# Patient Record
Sex: Female | Born: 1960 | Race: White | Hispanic: No | Marital: Married | Smoking: Never smoker
Health system: Southern US, Community
[De-identification: ages and names within clinical notes are randomized; demographics above are authoritative.]

---

## 2020-02-05 ENCOUNTER — Other Ambulatory Visit: Payer: Self-pay

## 2020-02-05 ENCOUNTER — Emergency Department (HOSPITAL_BASED_OUTPATIENT_CLINIC_OR_DEPARTMENT_OTHER): Payer: 59

## 2020-02-05 ENCOUNTER — Encounter (HOSPITAL_BASED_OUTPATIENT_CLINIC_OR_DEPARTMENT_OTHER): Payer: Self-pay | Admitting: *Deleted

## 2020-02-05 DIAGNOSIS — R6884 Jaw pain: Secondary | ICD-10-CM | POA: Diagnosis not present

## 2020-02-05 DIAGNOSIS — R0789 Other chest pain: Secondary | ICD-10-CM | POA: Diagnosis not present

## 2020-02-05 DIAGNOSIS — Z5321 Procedure and treatment not carried out due to patient leaving prior to being seen by health care provider: Secondary | ICD-10-CM | POA: Diagnosis not present

## 2020-02-05 LAB — BASIC METABOLIC PANEL
Anion gap: 10 (ref 5–15)
BUN: 14 mg/dL (ref 6–20)
CO2: 28 mmol/L (ref 22–32)
Calcium: 9.3 mg/dL (ref 8.9–10.3)
Chloride: 103 mmol/L (ref 98–111)
Creatinine, Ser: 0.69 mg/dL (ref 0.44–1.00)
GFR calc Af Amer: >60 mL/min (ref >60)
GFR calc non Af Amer: >60 mL/min (ref >60)
Glucose, Bld: 115 mg/dL — ABNORMAL HIGH (ref 70–99)
Potassium: 4 mmol/L (ref 3.5–5.1)
Sodium: 141 mmol/L (ref 135–145)

## 2020-02-05 LAB — TROPONIN I (HIGH SENSITIVITY): Troponin I (High Sensitivity): 2 ng/L (ref <18)

## 2020-02-05 LAB — CBC
HCT: 42.5 % (ref 36.0–46.0)
Hemoglobin: 13.8 g/dL (ref 12.0–15.0)
MCH: 29.9 pg (ref 26.0–34.0)
MCHC: 32.5 g/dL (ref 30.0–36.0)
MCV: 92.2 fL (ref 80.0–100.0)
Platelets: 200 10*3/uL (ref 150–400)
RBC: 4.61 MIL/uL (ref 3.87–5.11)
RDW: 12.1 % (ref 11.5–15.5)
WBC: 8.4 10*3/uL (ref 4.0–10.5)
nRBC: 0 % (ref 0.0–0.2)

## 2020-02-05 MED ORDER — SODIUM CHLORIDE 0.9% FLUSH
3.0000 mL | Freq: Once | INTRAVENOUS | Status: DC
  Filled 2020-02-05: qty 3

## 2020-02-05 NOTE — ED Triage Notes (Signed)
Chest tightness. She had tightness in her left jaw a week ago that she ignored.

## 2020-02-06 ENCOUNTER — Emergency Department (HOSPITAL_BASED_OUTPATIENT_CLINIC_OR_DEPARTMENT_OTHER)
Admission: EM | Admit: 2020-02-06 | Discharge: 2020-02-06 | Disposition: A | Discharge status: Home / Self Care | Payer: 59 | Attending: Emergency Medicine | Admitting: Emergency Medicine

## 2020-02-06 NOTE — ED Notes (Signed)
Called to take to room  No response from lobby  

## 2020-02-06 NOTE — ED Notes (Signed)
Called for second time  No response  

## 2021-12-20 IMAGING — CR DG CHEST 2V
2 series · 2 of 2 positions shown · non-contrast
Comparison: None.

CLINICAL DATA: Chest pain and tightness.

EXAM:
CHEST - 2 VIEW

[w chest pa]
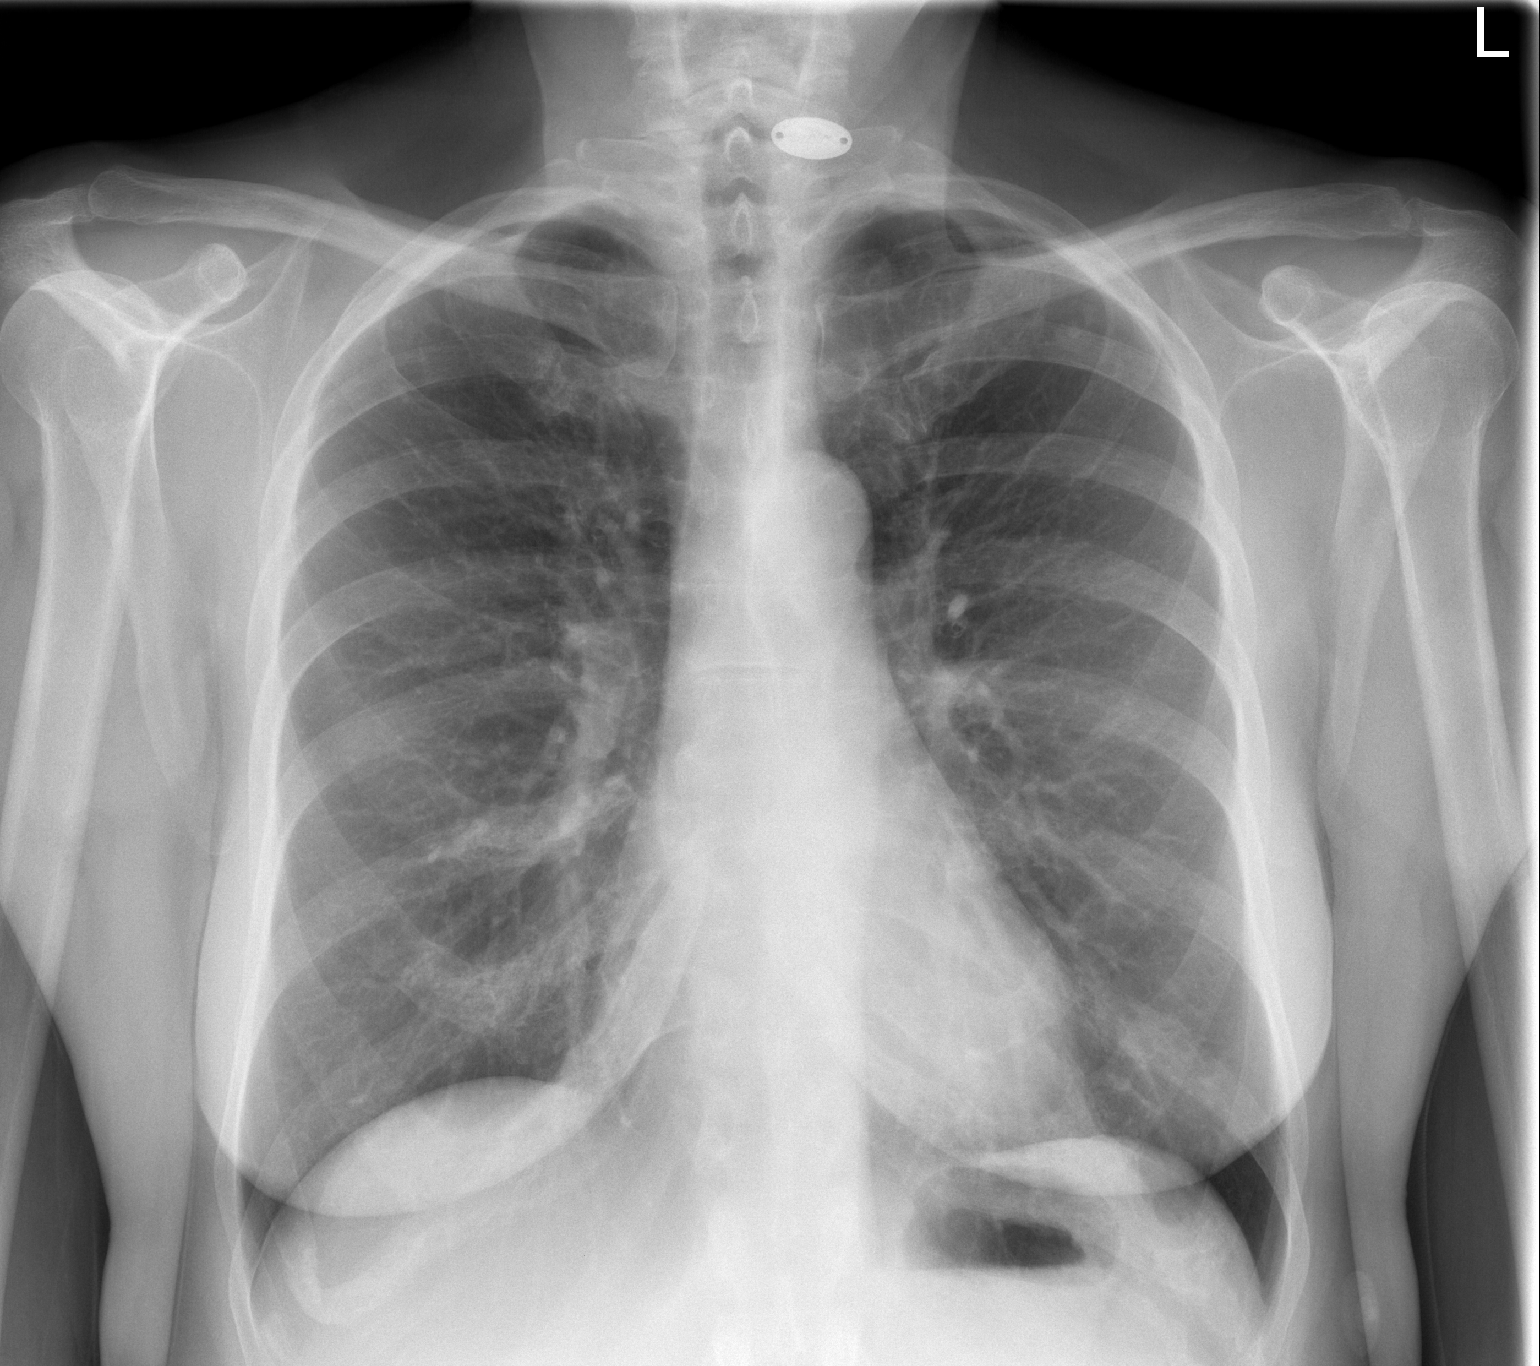

[w chest lat]
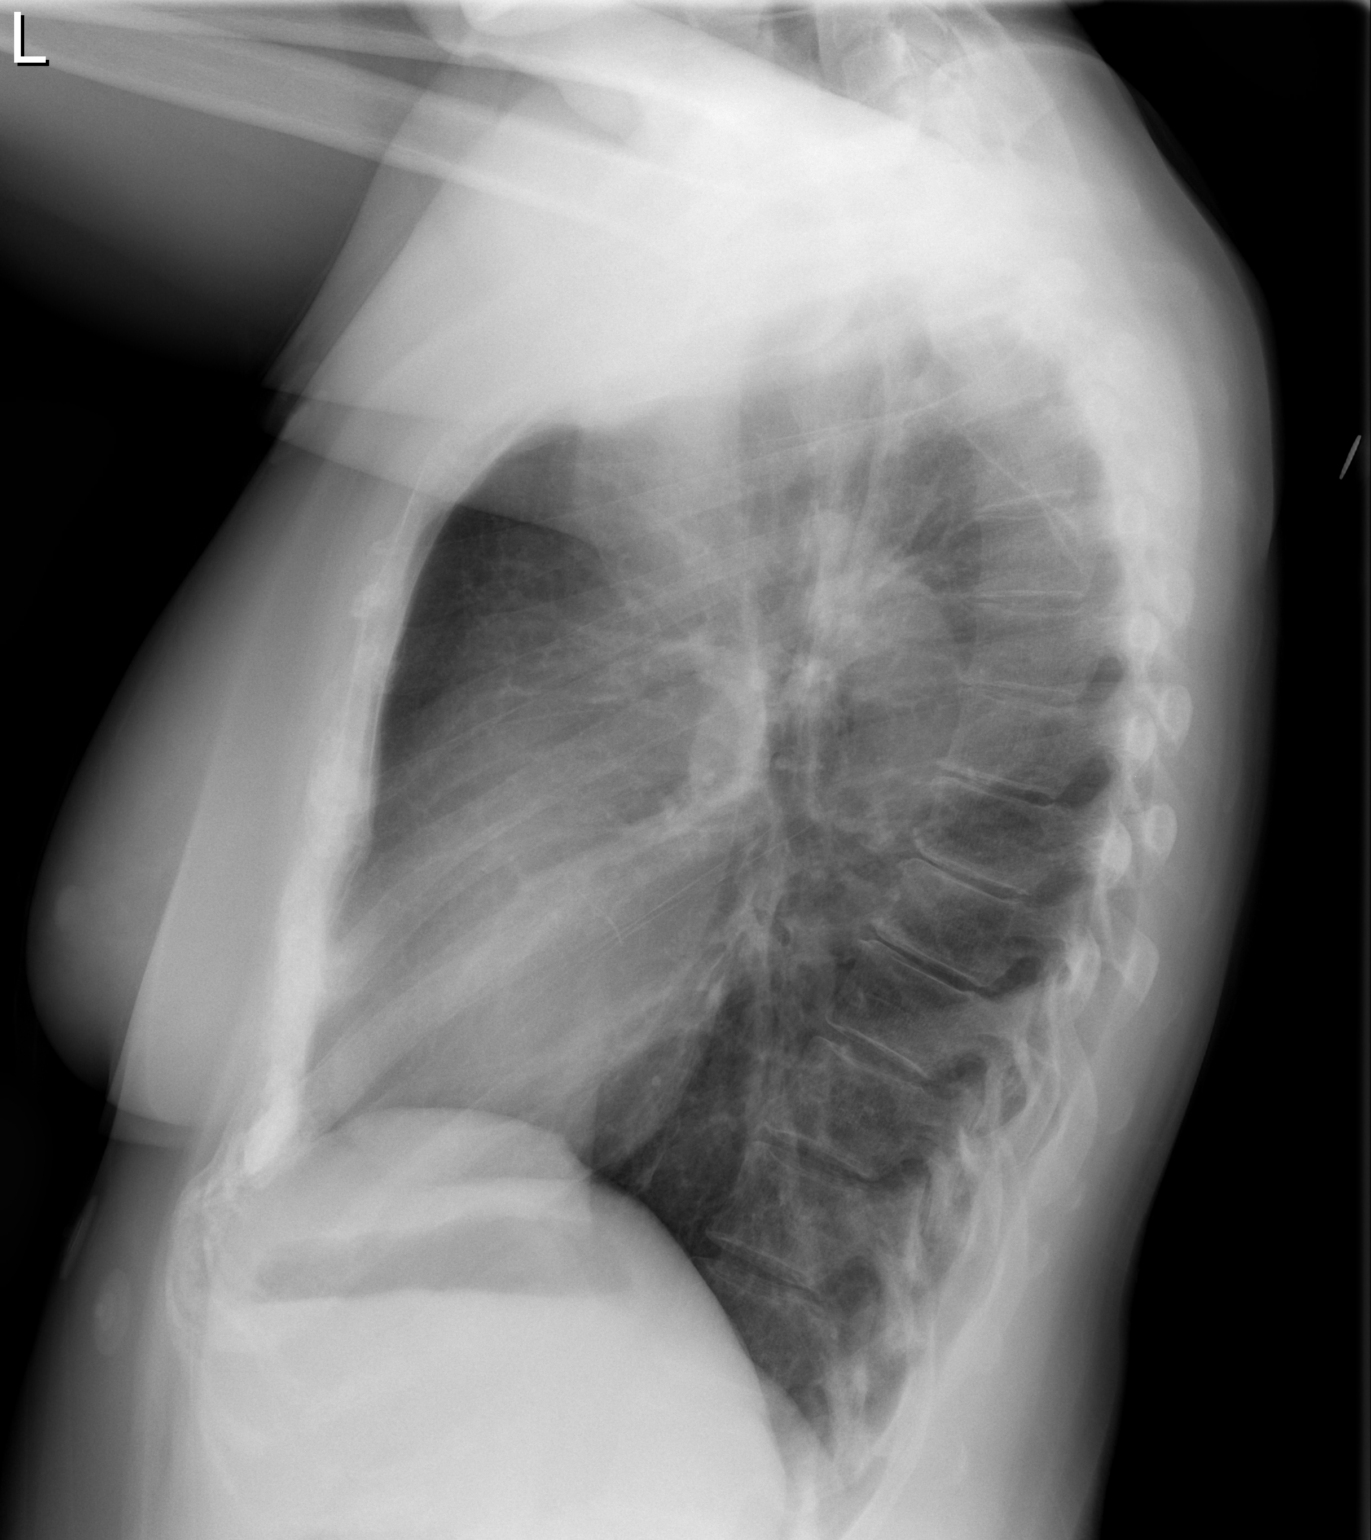

[2 of 2 positions shown; findings below may reference images not displayed]

FINDINGS: Normal sized heart. Clear lungs with normal vascularity. Mild
thoracic spine degenerative changes. Artifact overlying the lower
neck on the left.
IMPRESSION: No acute abnormality.
